# Patient Record
Sex: Female | Born: 1955 | Race: Black or African American | Hispanic: No | Marital: Single | Smoking: Never smoker
Health system: Southern US, Community
[De-identification: ages and names within clinical notes are randomized; demographics above are authoritative.]

## PROBLEM LIST (undated history)

## (undated) DIAGNOSIS — F419 Anxiety disorder, unspecified: Secondary | ICD-10-CM

## (undated) DIAGNOSIS — I219 Acute myocardial infarction, unspecified: Secondary | ICD-10-CM

## (undated) DIAGNOSIS — I10 Essential (primary) hypertension: Secondary | ICD-10-CM

## (undated) DIAGNOSIS — E119 Type 2 diabetes mellitus without complications: Secondary | ICD-10-CM

## (undated) DIAGNOSIS — I259 Chronic ischemic heart disease, unspecified: Secondary | ICD-10-CM

## (undated) DIAGNOSIS — I519 Heart disease, unspecified: Secondary | ICD-10-CM

## (undated) DIAGNOSIS — G4733 Obstructive sleep apnea (adult) (pediatric): Secondary | ICD-10-CM

## (undated) DIAGNOSIS — F329 Major depressive disorder, single episode, unspecified: Secondary | ICD-10-CM

## (undated) DIAGNOSIS — E039 Hypothyroidism, unspecified: Secondary | ICD-10-CM

## (undated) HISTORY — PX: ABDOMINAL HYSTERECTOMY: SHX81

## (undated) HISTORY — PX: CORONARY ANGIOPLASTY WITH STENT PLACEMENT: SHX49

## (undated) HISTORY — PX: HERNIA REPAIR: SHX51

## (undated) HISTORY — DX: Chronic ischemic heart disease, unspecified: I25.9

## (undated) HISTORY — DX: Major depressive disorder, single episode, unspecified: F32.9

## (undated) HISTORY — DX: Obstructive sleep apnea (adult) (pediatric): G47.33

## (undated) HISTORY — DX: Essential (primary) hypertension: I10

## (undated) HISTORY — DX: Type 2 diabetes mellitus without complications (CMS-HCC): E11.9

## (undated) HISTORY — DX: Heart disease, unspecified: I51.9

## (undated) HISTORY — DX: Hypothyroidism, unspecified: E03.9

---

## 2017-02-20 ENCOUNTER — Ambulatory Visit (INDEPENDENT_AMBULATORY_CARE_PROVIDER_SITE_OTHER): Payer: Medicare (Managed Care) | Admitting: Surgery

## 2017-02-20 ENCOUNTER — Encounter (INDEPENDENT_AMBULATORY_CARE_PROVIDER_SITE_OTHER): Payer: Self-pay | Admitting: Surgery

## 2017-02-20 VITALS — BP 90/62 | HR 65 | Temp 98.1°F | Resp 16 | Ht 66.54 in | Wt 171.0 lb

## 2017-02-20 DIAGNOSIS — K439 Ventral hernia without obstruction or gangrene: Secondary | ICD-10-CM

## 2017-02-20 DIAGNOSIS — K429 Umbilical hernia without obstruction or gangrene: Secondary | ICD-10-CM

## 2017-02-20 NOTE — Patient Instructions (Signed)
Please call your Cardiologist later this week to confirm that they've sent me the stress test results.    Contact my office after the stress test results have been sent to schedule your procedure.    You will need to stop you Plavix for 7-10 days prior to the surgery date.        Please call my office at [phone] to speak with my staff and to arrange a date for surgery.    You will need to meet with the anesthesia doctors at a separate appointment, usually within a week of the surgery date.  This appointment will be arranged by my office staff.    Nothing to eat or drink, by mouth, after midnight the night prior to surgery.    Please stop all herbal supplements, including fish oil, for 2 weeks prior to your operation.    Hold all NSAIDs (aspirin, ibuprofen, naprosyn, etc) and Clopidogrel (Plavix) 10-14 days prior to surgery.    Take all regular medications with a sip of water on morning of surgery except NSAIDs.

## 2017-02-20 NOTE — Progress Notes (Signed)
History and Physical     Referring MD:  Mar[facility]adip  [address]  [address]  [place], [place] [address]    Attending MD:   Dr. Lynne Logan    Chief Complaint: ventral hernia     History of Present Illness:     Catherine Harmon is a 61 year old female who is here for evaluation of the above chief complaint. Says has had a "lump" on stomach, unchanged in size, a/w some pain to palpation, present approx one year; reports never had surgery on abdomen before. Notes hernia not entirely reducible, as it "pops back out" when pressed. Denies overlying redness, drainage or other hernias in other locations. Denies F/C; had a cold last week. Denies N/V, CP or SOB. Takes daily plavix and ASA 325, last dose this morning.    Pain Assessment: Intensity: 8 Location: ventral hernia Quality: cramping    PMHx:  - HTN  - pre-diabetes  - CAD s/p MI (approx 2006) s/p PCI    PSx:  - hysterectomy  - R elbow fracture repair    Medications:   - patient will call back with list, states she does not remember names but says ~15    Allergies:  - sudafed (hyperventilation)  No current outpatient prescriptions on file.     No current facility-administered medications for this visit.      Social History     Social History    Marital status: Single     Spouse name: N/A    Number of children: N/A    Years of education: N/A     Social History Main Topics    Smoking status: Not on file    Smokeless tobacco: Not on file    Alcohol use Not on file    Drug use: Not on file    Sexual activity: Not on file     Social Activities of Daily Living Present    Not on file     Social History Narrative   SHx:  - former smoker, quit 1 year ago  - denies EtOH  - denies recreational drugs    FHx:  - MS (brother)  - "back cancer" (brother)  - DM (mother)    Review of Systems: Negative except per HPI    Physical Exam:  BP 90/62 (BP Location: Left arm, BP Patient Position: Sitting)   Pulse 65   Temp 98.1 F (36.7 C)   Resp 16   Ht 5' 6.53" (1.69 m)    Wt 77.6 kg (171 lb)   SpO2 93%   BMI 27.16 kg/m2     Gen - NAD, AA&O x3, slow but appropriate responses  Abd - one small umbilical hernia appreciated; epigastric hernia appreciated 2-3 fingerbreadths above umbilicus, mild pain to palpation, reducible, no overlying skin changes.    Assessment and Care Plan:  46 F w/PMHx significant for CAD c/b MI s/p PCI on ASA and Plavix, presents for evaluation of epigastric hernia; also found to have umbilical hernia. Plan for open hernia repair for both.    1. Discussed possibility of incarceration, strangulation, enlargement in size over time, and the risk of emergency surgery in the face of strangulation.  Also discussed the risk of surgery including recurrence which can be up to 50% in the case of incisional or complex hernias, use of prosthetic materials (mesh) and the increased risk of infxn, post-op infxn and the possible need for re-operation and removal of mesh if used, possibility  of post-op SBO or ileus, and the risks of general anesthetic including MI, CVA, sudden death or even reaction to anesthetic medications. The patient understands the risks, any and all questions were answered to the patient's satisfaction.    2. Patient has elected to proceed with surgical treatment. Procedure will be scheduled.  Written consent was obtained. The risks, benefits and alternatives of the planned procedure have been discussed with the patient and/or her legal representative, all questions have been answered.    3. Need patient's recent cardiac testing results from cardiologist prior to surgical procedure and anesthesia.    4. Will have to hold Plavix 7-10 days prior to procedure.    Does the patient have decision making capacity at this time? Yes    Patient and plan seen and discussed with Dr. Cheron SchaumannSandler.    Note Author: Cyndia DiverEmily Marylouise Mallet, MD

## 2017-02-20 NOTE — Progress Notes (Signed)
Attending Addendum:    Patient seen and examined - agree with resident/student's note.    She is a 61yo woman with a small, reducible epigastric hernia as well as a tiny umbilical hernia that are symptomatic and she would like repaired.  She has a history of MI in 2005, requiring PCI.  She had a stress test within the past year.    We had a lengthy discussion regarding the options for surgery.  Both open and laparoscopic repair options were discussed.   The risks of bleeding, infection, recurrence, possible internal organ, bowel or bladder injury, possible need for additional operation, including possible mesh removal, and the possibility of post-operative pain, both acutely and chronically, were all discussed.  She understands these risks and wishes to proceed with surgery.  Consent was obtained today for open epigastric and umbilical hernia repair with possible mesh.      I will ask her Cardiologist for a copy of her recent stress test.  Will schedule for open epigastric and umbilical hernia repair with possible mesh once we get a copy of her stress test.  I told her that I recommend holding her Plavix, if possible, for 7-10 days prior to her surgical procedure.        Mickle AsperBryan J. Cheron SchaumannSandler, MD FACS    H.S. Associate Professor of Surgery  Division of Minimally Invasive Surgery, Department of Surgery    Email: [email or web address]  Office: [phone]

## 2017-03-15 ENCOUNTER — Telehealth (INDEPENDENT_AMBULATORY_CARE_PROVIDER_SITE_OTHER): Payer: Self-pay | Admitting: Surgery

## 2017-03-15 NOTE — Telephone Encounter (Signed)
Pt calling to schedule surgery w/Dr Cheron Schaumann.    Requests call back    [phone]

## 2017-03-15 NOTE — Telephone Encounter (Signed)
Spoke to pt regarding the next available OR date.  I had left a message last Friday offering this Friday but pt had to be off her Plavix for 7 days and due to phone issues, she did not get my message until too late.    Pt is currently scheduled for 05/12/17 and I will call her if anyone cancels before then.

## 2017-04-19 ENCOUNTER — Telehealth (INDEPENDENT_AMBULATORY_CARE_PROVIDER_SITE_OTHER): Payer: Self-pay | Admitting: Surgery

## 2017-04-19 NOTE — Telephone Encounter (Signed)
Catherine Harmon      MR#:  [id number]      Thank you for choosing [facility] for your healthcare. The following arrangements have been made for your upcoming surgery:    Please remember not to eat or drink anything after midnight the night before surgery and to have a driver with you who can drive you home after your procedure.    Post-op instructions are attached.      Friday, May 12, 2017      TBD  Surgery Check-In    Front Information Desk, 1st Floor Lobby    [facility]  [address]  [place], [place]  [phone]      Open Umbilical Hernia Repair with Possible Mesh & Epigastric Hernia Repair by Dr Cheron SchaumannSandler      **OR time subject to change, I will call you in the afternoon the day before surgery with check in time**            Monday, May 29, 2017    11:20 a.m. Post-Operative Appointment     [facility]    [address]    [place], [place]  [id number] [facility] [place] [facility] on right)

## 2017-05-02 ENCOUNTER — Ambulatory Visit (HOSPITAL_BASED_OUTPATIENT_CLINIC_OR_DEPARTMENT_OTHER): Payer: Medicare (Managed Care) | Admitting: Anesthesiology

## 2017-05-11 ENCOUNTER — Telehealth (INDEPENDENT_AMBULATORY_CARE_PROVIDER_SITE_OTHER): Payer: Self-pay | Admitting: Surgery

## 2017-05-11 NOTE — Telephone Encounter (Signed)
Spoke to pt regarding check in time of 5:30 am for surgery on 05/12/17 with Dr Cheron SchaumannSandler.  Reminded pt to remain NPO after midnight and to have a driver.

## 2017-05-12 ENCOUNTER — Encounter (HOSPITAL_BASED_OUTPATIENT_CLINIC_OR_DEPARTMENT_OTHER): Admission: RE | Disposition: A | Discharge status: Home Health | Payer: Self-pay | Attending: Surgery

## 2017-05-12 ENCOUNTER — Ambulatory Visit
Admission: RE | Admit: 2017-05-12 | Discharge: 2017-05-12 | Disposition: A | Discharge status: Home / Self Care | Payer: Medicare (Managed Care) | Attending: Surgery | Admitting: Surgery

## 2017-05-12 DIAGNOSIS — Z79899 Other long term (current) drug therapy: Secondary | ICD-10-CM | POA: Insufficient documentation

## 2017-05-12 DIAGNOSIS — E039 Hypothyroidism, unspecified: Secondary | ICD-10-CM | POA: Insufficient documentation

## 2017-05-12 DIAGNOSIS — R0989 Other specified symptoms and signs involving the circulatory and respiratory systems: Secondary | ICD-10-CM | POA: Insufficient documentation

## 2017-05-12 DIAGNOSIS — R0789 Other chest pain: Secondary | ICD-10-CM | POA: Insufficient documentation

## 2017-05-12 DIAGNOSIS — R011 Cardiac murmur, unspecified: Secondary | ICD-10-CM | POA: Insufficient documentation

## 2017-05-12 DIAGNOSIS — Z7982 Long term (current) use of aspirin: Secondary | ICD-10-CM | POA: Insufficient documentation

## 2017-05-12 DIAGNOSIS — I10 Essential (primary) hypertension: Secondary | ICD-10-CM | POA: Insufficient documentation

## 2017-05-12 DIAGNOSIS — R29810 Facial weakness: Secondary | ICD-10-CM | POA: Insufficient documentation

## 2017-05-12 DIAGNOSIS — K429 Umbilical hernia without obstruction or gangrene: Secondary | ICD-10-CM | POA: Insufficient documentation

## 2017-05-12 DIAGNOSIS — G4733 Obstructive sleep apnea (adult) (pediatric): Secondary | ICD-10-CM | POA: Insufficient documentation

## 2017-05-12 DIAGNOSIS — Z955 Presence of coronary angioplasty implant and graft: Secondary | ICD-10-CM | POA: Insufficient documentation

## 2017-05-12 DIAGNOSIS — E7800 Pure hypercholesterolemia, unspecified: Secondary | ICD-10-CM | POA: Insufficient documentation

## 2017-05-12 DIAGNOSIS — K439 Ventral hernia without obstruction or gangrene: Secondary | ICD-10-CM

## 2017-05-12 DIAGNOSIS — K219 Gastro-esophageal reflux disease without esophagitis: Secondary | ICD-10-CM | POA: Insufficient documentation

## 2017-05-12 DIAGNOSIS — F419 Anxiety disorder, unspecified: Secondary | ICD-10-CM | POA: Insufficient documentation

## 2017-05-12 DIAGNOSIS — I739 Peripheral vascular disease, unspecified: Secondary | ICD-10-CM | POA: Insufficient documentation

## 2017-05-12 DIAGNOSIS — K458 Other specified abdominal hernia without obstruction or gangrene: Secondary | ICD-10-CM | POA: Insufficient documentation

## 2017-05-12 DIAGNOSIS — I259 Chronic ischemic heart disease, unspecified: Secondary | ICD-10-CM | POA: Insufficient documentation

## 2017-05-12 DIAGNOSIS — I2510 Atherosclerotic heart disease of native coronary artery without angina pectoris: Secondary | ICD-10-CM | POA: Insufficient documentation

## 2017-05-12 DIAGNOSIS — Z7902 Long term (current) use of antithrombotics/antiplatelets: Secondary | ICD-10-CM | POA: Insufficient documentation

## 2017-05-12 DIAGNOSIS — Z538 Procedure and treatment not carried out for other reasons: Principal | ICD-10-CM | POA: Insufficient documentation

## 2017-05-12 DIAGNOSIS — I341 Nonrheumatic mitral (valve) prolapse: Secondary | ICD-10-CM | POA: Insufficient documentation

## 2017-05-12 DIAGNOSIS — F329 Major depressive disorder, single episode, unspecified: Secondary | ICD-10-CM | POA: Insufficient documentation

## 2017-05-12 DIAGNOSIS — E119 Type 2 diabetes mellitus without complications: Secondary | ICD-10-CM | POA: Insufficient documentation

## 2017-05-12 LAB — GLUCOSE (POCT): Glucose (POCT): 118 mg/dL — ABNORMAL HIGH (ref 70–99)

## 2017-05-12 SURGERY — REPAIR, HERNIA, UMBILICAL
Site: Abdomen

## 2017-05-12 MED ORDER — BUPIVACAINE HCL (PF) 0.25 % IJ SOLN
INTRAMUSCULAR | Status: DC
Start: 2017-05-12 — End: 2017-05-12
  Filled 2017-05-12: qty 90

## 2017-05-12 MED ORDER — LACTATED RINGERS IV SOLN
INTRAVENOUS | Status: DC
Start: 2017-05-12 — End: 2017-05-12
  Administered 2017-05-12: 06:00:00 via INTRAVENOUS

## 2017-05-12 MED ORDER — LIDOCAINE HCL (PF) 1 % IJ SOLN
0.1000 mL | Freq: Once | INTRAMUSCULAR | Status: AC | PRN
Start: 2017-05-12 — End: 2017-05-12
  Administered 2017-05-12: 0.1 mL via INTRADERMAL
  Filled 2017-05-12: qty 2

## 2017-05-12 NOTE — Anesthesia Preprocedure Evaluation (Addendum)
ANESTHESIA PRE-OPERATIVE EVALUATION    Patient Information    Name: Catherine Harmon    MRN: [id number]    DOB: July 05, 1956    Age: 61 year old    Sex: female  Procedure(s):  REPAIR, HERNIA, UMBILICAL & Epigastric Hernia Repair      Pre-op Vitals:   BP 119/80 (BP Location: Left arm, BP Patient Position: Semi-Fowlers)   Pulse 70   Temp 36.1 C   Resp 15   Ht 5' 6.69" (1.694 m)   Wt 76 kg (167 lb 8 oz)   SpO2 99%   BMI 26.48 kg/m2   BMI kg/m2: 26.48 kg/m2    Primary language spoken:  English    ROS/Medical History:      General Review & History of Anesthetic Complications:        Outpatient meds:  abilify  Acyclovir  amlodipihne  ASA 81  atorvasstatin  Benztropine  Carvedilol  effexor  Hydroxyzine  Lisinopril  plavix  Venlafaxine  Zolpidem   Cardiovascular:    Exercise tolerance: >4 METS  (+)  hypercholesterolemia  hypertension  CADcardiac stents (2006)       (-) DOE    ROS comment: 04/28/15 - negative AMIBI, EF 42  07/08/16 - lexiscan possible septal/inferior reversible ischemia, EF 69  09/16/16 - TTE normal LV size and contractility    Peripheral vascular disease  Atypical chest pain relieved with tums    Mild mitral prolapse without significant regurg           Pulmonary:  - negative ROS     (-) asthma Hematology/Oncology:   - negative ROS   Neuro/Psych:            ROS Comments: Anxiety depression Infectious Disease:   Negative ROS         Endo/Other:      (+) diabetes mellitus (prediabetes, no meds),  GI/Hepatic:     (+) GERD well controlled,    Renal:    - Negative ROS        Pregnancy History:             Pre Anesthesia Testing (PCC/CPC) notes/comments:                   Physical Exam    Airway:  Inter-inciser distance 3-4 cm  Prognanth Able    Mallampati: II  Neck ROM: full  TM distance: 5-6 cm          Cardiovascular:    Rhythm: regular   Rate: normal   Positive for murmur and carotid bruit (bilateral)  Negative for peripheral edema     Pulmonary:       breath sounds clear to  auscultation        Neuro/Neck/Skeletal/Skin:      Dental:    (+) lower dentures and upper dentures    Abdominal:              Last  OSA (STOP BANG) Score:  No Data Recorded    Last OSA  (STOP) Score for   Has a physician diagnosed you with sleep apnea?: Yes  Do you use a CPAP at home?: Yes  Do you snore loudly (loud enough to be heard through a closed door)?: 1  Do you often feel tired, fatigued or sleepy during the day?: 1  Has anyone observed that you stop breathing while you are sleeping?: 1  Have you ever been treated for high blood pressure?: 1  OSA total score (A  score of 2 or more is high risk. Offer patient sleep study.): 4      Has a physician diagnosed you with sleep apnea?: Yes  Do you use a CPAP at home?: Yes  OSA total score (A score of 2 or more is high risk. Offer patient sleep study.): 4    Past Medical History:   Diagnosis Date    Diabetes mellitus (CMS-HCC)     Heart disease     Hypertension     Hypothyroidism     Ischemic heart disease     Major depressive disorder, single episode     OSA (obstructive sleep apnea)      No past surgical history on file.  Social History   Substance Use Topics    Smoking status: Never Smoker    Smokeless tobacco: Never Used    Alcohol use No       Current Facility-Administered Medications   Medication Dose Route Frequency Provider Last Rate Last Dose    lactated ringers infusion   IntraVENOUS Continuous Donnie Mesa, MD 30 mL/hr at 05/12/17 0615       No Known Allergies    Labs and Other Data  No results found for: NA, SODIUM, K, CL, BICARB, BUN, CREAT, GLU, [place]  No results found for: AST, ALT, GGT, LDH, ALK, TP, ALB, TBILI, DBILI  No results found for: WBC, RBC, HGB, HCT, MCV, MCHC, RDW, PLT, PLCTEL, MPV, MPVH, SEG, LYMPHS, MONOS, EOS, BASOS  No results found for: INR, PTT  No results found for: ARTPH, ARTPO2, ARTPCO2    Anesthesia Plan:  Risks and Benefits of Anesthesia  I personally examined the patient immediately prior to the anesthetic and  reviewed the pertinent medical history, drug and allergy history, laboratory and imaging studies and consultations. I have determined that the patient has had adequate assessment and testing.    Anesthetic techniques, invasive monitors, anesthetic drugs for induction, maintenance and post-operative analgesia, risks and alternatives have been explained to the patient and/or patient's representatives.    I have prescribed the anesthetic plan:         Planned anesthesia method: General         ASA 3 (Severe systemic disease)     Potential anesthesia problems identified and risks including but not limited to the following were discussed with patient and/or patient's representative: Dental injury or sore throat and Patient declined further  discussion of risks of anesthesia    Planned monitoring method: Routine monitoring  Comments: (Case cancelled for findings of new onset facial droop and bilateral carotid bruits. Patient to see PMD for workup. )    Informed Consent:  Anesthetic plan and risks discussed with Patient.

## 2017-05-12 NOTE — H&P (Deleted)
HISTORY & PHYSICAL - INTERVAL ASSESSMENT   **ONLY TO BE USED IN ADDITION TO A HISTORY & PHYSICAL**    Leonette MostLawanda J Ashworth  [id number]     This interval assessment is required for History & Physical completed less than 30 days but more than 24 hours prior to the admission or surgery. A History & Physical completed more than 30 days prior to the admission or surgery must be repeated.     Current Medical Status:   Unchanged     Medications / Allergies:   Unchanged     Review of Systems:   Unchanged     Physical Examination:   Unchanged     Laboratory or Clinical Data:   Unchanged     Modifications of Initial Care Plan:   Unchanged - Plan to proceed with open epigastric and umbilical hernia repair.      Mickle AsperBryan J. Cheron SchaumannSandler, MD    Associate Professor of Surgery  Division of Minimally Invasive Surgery, Department of Surgery    Office: [phone]

## 2017-05-12 NOTE — H&P (Addendum)
History and Physical     Demographics:  Date: 02/20/2017   Patient Name: Catherine Harmon   Medical Record #: [id number]   DOB: 07/13/1956  Age: 61 year old  Sex: female      Referring MD:  Catherine Harmon, Catherine Fix Jamey, MD  [address]  [place], [place] [phone]      Chief Complaint   Patient presents with    No chief complaint on file.          History of Present Illness:     Subjective:     Catherine Harmon is an 61 year old female who has an epigastric and umbilical hernia that she would like repaired.       Past Medical History:   Diagnosis Date    Diabetes mellitus (CMS-HCC)     Heart disease     Hypertension     Hypothyroidism     Ischemic heart disease     Major depressive disorder, single episode     OSA (obstructive sleep apnea)        No past surgical history on file.    No Known Allergies    No current facility-administered medications on file prior to encounter.      No current outpatient prescriptions on file prior to encounter.       Social History     Social History    Marital status: Single     Spouse name: N/A    Number of children: N/A    Years of education: N/A     Social History Main Topics    Smoking status: Never Smoker    Smokeless tobacco: Never Used    Alcohol use No    Drug use: Not on file    Sexual activity: Not on file     Social Activities of Daily Living Present    Not on file     Social History Narrative    No narrative on file       No family history on file.      REVIEW OF SYSTEMS   GEN: The patient has no weight loss, fevers, or chills  EYES:No change in vision.  ENT: No change in hearing. No epistaxis.   PULM: No dyspnea, productive cough, or wheezing  CARDIO:  No chest pain, tachycardias, dyspnea on exertion.  GI: No jaundice, hematesesis, abdominal pain, diarrhea or constipation.  GU: No dysuria or hematuria  ENDO: No diabetes.  JOINT:  No new joint pains.  HEME/LYMPHATIC: No bleeding, anemias, or lymphadenopathy.  NEURO: No loss of consciousness,  headaches.    Physical Exam:  Vitals:    05/12/17 0550   BP: 119/80   BP Location: Left arm   BP Patient Position: Semi-Fowlers   Pulse: 70   Resp: 15   Temp: 97 F (36.1 C)   SpO2: 99%   Weight: 76 kg (167 lb 8 oz)   Height: 5' 6.69" (1.694 m)     Body mass index is 26.48 kg/(m^2).        GENERAL APPEARANCE:  Healthy, alert, no distress, pleasant affect, cooperative.  HEART: Normal rate and regular rhythm, no murmurs, clicks, or gallops.  LUNGS: Clear to auscultation and percussion, no chest deformities noted.  ABDOMEN:  BS normal. Abdomen soft, non-tender. Small, reducible epigastric hernia present with umbilical hernia as well.  EXTREMITIES:  No cyanosis, clubbing, or edema   SKIN:  Negative  NEURO: Non-focal.  Assessment:     Catherine Harmon is a 61 year old female patient with an epigastric and umbilical hernia that she would like repaired.      In discussing her with both the Anesthesiologist as well as her boyfriend, her boyfriend states that she has had some left-sided facial drooping that he has noticed since February.  I also appreciate this today on examination.      She had seen her Cardiologist, who had evaluated her cardiac condition.  She has not had any imaging of her carotids though, and given the concerning exam findings, along with her other risk factors (high cholesterol, known PVD and CAD, etc), I discussed this with our Anesthesia staff today and we will have her further evaluated by her PCP prior to proceeding with this entirely elective epigastric and umbilical hernia repair.

## 2017-05-12 NOTE — Progress Notes (Signed)
Anesthesiology Preoperative Assessment  [facility]  05/12/2017    Ms. Ashworth presented today for surgical repair of epigastric and umbilical hernia. Per patient history and provided letter from her cardiologist, she has a medical history of coronary artery disease status post coronary stents, inducible ischemia on Lexiscan stress test, peripheral vascular disease, hypertension, hyperlipidemia, and pre-diabetes. On interview patient was noted to have a mild left sided facial droop and slow verbal responses to questions. Her fiance noted that these changes were new since February and provided before and after photos for confirmation. On physical exam I found mild bilateral carotid bruits, clear lung sounds, and mild systolic murmur. Of note, Ms. Ashworth has held her aspirin and plavix for one week prior to surgery.    Given these findings and the increased perioperative risk for cerebrovascular accidents we have elected to delay surgery for further workup. I recommend that the patient receives a carotid artery ultrasound and any further testing as indicated. She is scheduled to see her primary care physician on Monday 05/15/17. I have advised her to resume aspirin and plavix today. This plan has been discussed with Ms. Ashworth and her partner.     Al Pimpleennis Erma Joubert, MD

## 2017-05-29 ENCOUNTER — Encounter (INDEPENDENT_AMBULATORY_CARE_PROVIDER_SITE_OTHER): Payer: Medicare (Managed Care) | Admitting: Surgery

## 2018-01-08 ENCOUNTER — Institutional Professional Consult (permissible substitution) (INDEPENDENT_AMBULATORY_CARE_PROVIDER_SITE_OTHER): Admitting: Neurology

## 2018-01-08 ENCOUNTER — Encounter (INDEPENDENT_AMBULATORY_CARE_PROVIDER_SITE_OTHER): Payer: Self-pay

## 2020-03-12 ENCOUNTER — Encounter (INDEPENDENT_AMBULATORY_CARE_PROVIDER_SITE_OTHER): Payer: Self-pay

## 2020-12-08 ENCOUNTER — Encounter (HOSPITAL_BASED_OUTPATIENT_CLINIC_OR_DEPARTMENT_OTHER): Payer: Self-pay | Admitting: *Deleted

## 2020-12-08 ENCOUNTER — Other Ambulatory Visit: Payer: Self-pay

## 2020-12-08 ENCOUNTER — Emergency Department (HOSPITAL_BASED_OUTPATIENT_CLINIC_OR_DEPARTMENT_OTHER): Payer: Medicare HMO

## 2020-12-08 ENCOUNTER — Emergency Department (HOSPITAL_BASED_OUTPATIENT_CLINIC_OR_DEPARTMENT_OTHER)
Admission: EM | Admit: 2020-12-08 | Discharge: 2020-12-08 | Disposition: A | Discharge status: Home / Self Care | Payer: Medicare HMO | Attending: Emergency Medicine | Admitting: Emergency Medicine

## 2020-12-08 DIAGNOSIS — R059 Cough, unspecified: Secondary | ICD-10-CM

## 2020-12-08 DIAGNOSIS — Z20822 Contact with and (suspected) exposure to covid-19: Secondary | ICD-10-CM | POA: Insufficient documentation

## 2020-12-08 DIAGNOSIS — R0981 Nasal congestion: Secondary | ICD-10-CM | POA: Diagnosis not present

## 2020-12-08 DIAGNOSIS — I2510 Atherosclerotic heart disease of native coronary artery without angina pectoris: Secondary | ICD-10-CM | POA: Diagnosis not present

## 2020-12-08 DIAGNOSIS — R067 Sneezing: Secondary | ICD-10-CM | POA: Insufficient documentation

## 2020-12-08 MED ORDER — FLUTICASONE PROPIONATE 50 MCG/ACT NA SUSP: 1.0000 | Freq: Every day | NASAL | 0 refills | Status: AC

## 2020-12-08 MED ORDER — BENZONATATE 100 MG PO CAPS: 100.0000 mg | ORAL_CAPSULE | Freq: Three times a day (TID) | ORAL | 0 refills | Status: AC

## 2020-12-08 NOTE — ED Triage Notes (Signed)
C/o cough congestion x 1 day

## 2020-12-08 NOTE — ED Provider Notes (Signed)
[facility] EMERGENCY DEPARTMENT Provider Note   CSN: [id number] Arrival date & time: 12/08/20  1426     History Chief Complaint  Patient presents with  . URI    Daurice Ashworth Waynetta Sandy is a 64 y.o. female with a hx of CAD who presents to the ED with complaints of URI sxs x 2 days. Patient states she has had nasal congestion, sneezing, and cough productive of yellow phlegm sputum. No alleviating/aggravating factors. No intervention PTA. Denies fever, chills, dyspnea, chest pain, nausea, vomiting, or diarrhea. Vaccinated against covid.   HPI     History reviewed. No pertinent past medical history.  There are no problems to display for this patient.   History reviewed. No pertinent surgical history.   OB History   No obstetric history on file.     No family history on file.     Home Medications Prior to Admission medications   Not on File    Allergies    Patient has no allergy information on record.  Review of Systems   Review of Systems  Constitutional: Negative for chills and fever.  HENT: Positive for congestion. Negative for ear pain and sore throat.   Respiratory: Positive for cough. Negative for shortness of breath.   Cardiovascular: Negative for chest pain.  Gastrointestinal: Negative for abdominal pain, diarrhea and vomiting.  Neurological: Negative for syncope.  All other systems reviewed and are negative.   Physical Exam Updated Vital Signs BP 138/84 (BP Location: Left Arm)   Pulse 74   Temp 99.5 F (37.5 C) (Oral)   Resp 20   Ht 5\' 8"  (1.727 m)   Wt 56.2 kg   SpO2 96%   BMI 18.85 kg/m   Physical Exam Vitals and nursing note reviewed.  Constitutional:      General: She is not in acute distress.    Appearance: She is well-developed.  HENT:     Head: Normocephalic and atraumatic.     Right Ear: Ear canal normal. Tympanic membrane is not perforated, erythematous, retracted or bulging.     Left Ear: Ear canal normal. Tympanic  membrane is not perforated, erythematous, retracted or bulging.     Ears:     Comments: No mastoid erythema/swelling/tenderness.     Nose: Congestion present.     Right Sinus: No maxillary sinus tenderness or frontal sinus tenderness.     Left Sinus: No maxillary sinus tenderness or frontal sinus tenderness.     Mouth/Throat:     Pharynx: Uvula midline. No oropharyngeal exudate or posterior oropharyngeal erythema.     Comments: Posterior oropharynx is symmetric appearing. Patient tolerating own secretions without difficulty. No trismus. No drooling. No hot potato voice. No swelling beneath the tongue, submandibular compartment is soft.  Eyes:     General:        Right eye: No discharge.        Left eye: No discharge.     Conjunctiva/sclera: Conjunctivae normal.     Pupils: Pupils are equal, round, and reactive to light.  Cardiovascular:     Rate and Rhythm: Normal rate and regular rhythm.     Heart sounds: No murmur heard.   Pulmonary:     Effort: Pulmonary effort is normal. No respiratory distress.     Breath sounds: Normal breath sounds. No wheezing, rhonchi or rales.  Abdominal:     General: There is no distension.     Palpations: Abdomen is soft.     Tenderness: There is no abdominal  tenderness.  Musculoskeletal:     Cervical back: Normal range of motion and neck supple. No edema or rigidity.  Lymphadenopathy:     Cervical: No cervical adenopathy.  Skin:    General: Skin is warm and dry.     Findings: No rash.  Neurological:     Mental Status: She is alert.  Psychiatric:        Behavior: Behavior normal.     ED Results / Procedures / Treatments   Labs (all labs ordered are listed, but only abnormal results are displayed) Labs Reviewed - No data to display  EKG None  Radiology DG Chest [facility] 1 View  Result Date: 12/08/2020 CLINICAL DATA:  Cough, congestion for 1 day, upper respiratory infection EXAM: PORTABLE CHEST 1 VIEW COMPARISON:  None. FINDINGS: Single  frontal view of the chest demonstrates an unremarkable cardiac silhouette. No airspace disease, effusion, or pneumothorax. No acute bony abnormalities. IMPRESSION: 1. No acute intrathoracic process. Electronically Signed   By: Sharlet Salina M.D.   On: 12/08/2020 15:43    Procedures Procedures (including critical care time)  Medications Ordered in ED Medications - No data to display  ED Course  I have reviewed the triage vital signs and the nursing notes.  Pertinent labs & imaging results that were available during my care of the patient were reviewed by me and considered in my medical decision making (see chart for details).    Yessenia Ashworth Moon was evaluated in Emergency Department on 12/08/2020 for the symptoms described in the history of present illness. He/she was evaluated in the context of the global COVID-19 pandemic, which necessitated consideration that the patient might be at risk for infection with the SARS-CoV-2 virus that causes COVID-19. Institutional protocols and algorithms that pertain to the evaluation of patients at risk for COVID-19 are in a state of rapid change based on information released by regulatory bodies including the CDC and federal and state organizations. These policies and algorithms were followed during the patient's care in the ED.  MDM Rules/Calculators/A&P                         Patient presents to the ED with complaints of nasal congestion & cough.  Nontoxic, vitals WNL.   Additional history obtained:  Additional history obtained from chart review & nursing note review.   Imaging Studies ordered:  I ordered imaging studies which included CXR, I independently visualized and interpreted imaging which showed no acute process.   ED Course:  Exam is without signs of AOM, AOE, or mastoiditis. Oropharyngeal exam is benign. No sinus tenderness. No meningeal signs. Lungs are CTA without focal adventitious sounds, no signs of increased work of breathing,  CXR without infiltrate, doubt CAP. Abdomen nontender w/o peritoneal signs. Overall suspect viral vs allergic.  COVID testing obtained & pending. Recommended supportive care. I discussed results, treatment plan, need for follow-up, and return precautions with the patient.. Provided opportunity for questions, patient confirmed understanding and is in agreement with plan.   Portions of this note were generated with Scientist, clinical (histocompatibility and immunogenetics). Dictation errors may occur despite best attempts at proofreading.   Final Clinical Impression(s) / ED Diagnoses Final diagnoses:  Cough    Rx / DC Orders ED Discharge Orders         Ordered    fluticasone (FLONASE) 50 MCG/ACT nasal spray  Daily        12/08/20 1821    benzonatate (TESSALON) 100 MG capsule  Every 8 hours        12/08/20 1821           Dawnmarie Breon, Pleas Koch, PA-C 12/08/20 Lonna Duval, MD 12/08/20 [phone]

## 2020-12-08 NOTE — Discharge Instructions (Addendum)
You were seen in the ER today for cough and congestion.  Your chest x-ray was normal.   We have tested you for COVID 19, we will call you within the next 48 hours if results are positive, you may also view these results on MyChart.   We are instructing patient's with COVID 19 or symptoms of COVID 19 to isolate themselves for 14 days. You may be able to discontinue self isolation if the following conditions are met:   Persons with COVID-19 who have symptoms and were directed to care for themselves at home may discontinue home isolation under the  following conditions: - It has been at least 10 days have passed since symptoms first appeared. - AND at least 3 days (72 hours) have passed since recovery defined as resolution of fever without the use of fever-reducing medications and improvement in respiratory symptoms (e.g., cough, shortness of breath)  Please follow the below instructions.   We suspect your symptoms are related to a virus or allergies.  We are sending home with Flonase to use 1 spray per nostril daily as needed for nasal congestion as well as Tessalon to take every 8 hours as needed for coughing.  We have prescribed you new medication(s) today. Discuss the medications prescribed today with your pharmacist as they can have adverse effects and interactions with your other medicines including over the counter and prescribed medications. Seek medical evaluation if you start to experience new or abnormal symptoms after taking one of these medicines, seek care immediately if you start to experience difficulty breathing, feeling of your throat closing, facial swelling, or rash as these could be indications of a more serious allergic reaction   Please follow up with primary care within 3-5 days for re-evaluation- call prior to going to the office to make them aware of your symptoms as some offices are altering their method of seeing patients with COVID 19 symptoms, we have also provided our  [place] covid clinic for follow up as well.  Return to the ER for new or worsening symptoms including but not limited to increased work of breathing, fever, coughing up blood, chest pain, passing out, or any other concerns.       Person Under Monitoring Name: Sara Yoder  Location: [address] [place] [address]   Infection Prevention Recommendations for Individuals Confirmed to have, or Being Evaluated for, 2019 Novel Coronavirus (COVID-19) Infection Who Receive Care at Home  Individuals who are confirmed to have, or are being evaluated for, COVID-19 should follow the prevention steps below until a healthcare provider or local or state health department says they can return to normal activities.  Stay home except to get medical care You should restrict activities outside your home, except for getting medical care. Do not go to work, school, or public areas, and do not use public transportation or taxis.  Call ahead before visiting your doctor Before your medical appointment, call the healthcare provider and tell them that you have, or are being evaluated for, COVID-19 infection. This will help the healthcare provider's office take steps to keep other people from getting infected. Ask your healthcare provider to call the local or state health department.  Monitor your symptoms Seek prompt medical attention if your illness is worsening (e.g., difficulty breathing). Before going to your medical appointment, call the healthcare provider and tell them that you have, or are being evaluated for, COVID-19 infection. Ask your healthcare provider to call the local or state health department.  Wear a facemask You should wear a facemask that covers your nose and mouth when you are in the same room with other people and when you visit a healthcare provider. People who live with or visit you should also wear a facemask while they are in the same room with you.  Separate  yourself from other people in your home As much as possible, you should stay in a different room from other people in your home. Also, you should use a separate bathroom, if available.  Avoid sharing household items You should not share dishes, drinking glasses, cups, eating utensils, towels, bedding, or other items with other people in your home. After using these items, you should wash them thoroughly with soap and water.  Cover your coughs and sneezes Cover your mouth and nose with a tissue when you cough or sneeze, or you can cough or sneeze into your sleeve. Throw used tissues in a lined trash can, and immediately wash your hands with soap and water for at least 20 seconds or use an alcohol-based hand rub.  Wash your [facility] your hands often and thoroughly with soap and water for at least 20 seconds. You can use an alcohol-based hand sanitizer if soap and water are not available and if your hands are not visibly dirty. Avoid touching your eyes, nose, and mouth with unwashed hands.   Prevention Steps for Caregivers and Household Members of Individuals Confirmed to have, or Being Evaluated for, COVID-19 Infection Being Cared for in the Home  If you live with, or provide care at home for, a person confirmed to have, or being evaluated for, COVID-19 infection please follow these guidelines to prevent infection:  Follow healthcare provider's instructions Make sure that you understand and can help the patient follow any healthcare provider instructions for all care.  Provide for the patient's basic needs You should help the patient with basic needs in the home and provide support for getting groceries, prescriptions, and other personal needs.  Monitor the patient's symptoms If they are getting sicker, call his or her medical provider and tell them that the patient has, or is being evaluated for, COVID-19 infection. This will help the healthcare provider's office take steps to keep  other people from getting infected. Ask the healthcare provider to call the local or state health department.  Limit the number of people who have contact with the patient If possible, have only one caregiver for the patient. Other household members should stay in another home or place of residence. If this is not possible, they should stay in another room, or be separated from the patient as much as possible. Use a separate bathroom, if available. Restrict visitors who do not have an essential need to be in the home.  Keep older adults, very young children, and other sick people away from the patient Keep older adults, very young children, and those who have compromised immune systems or chronic health conditions away from the patient. This includes people with chronic heart, lung, or kidney conditions, diabetes, and cancer.  Ensure good ventilation Make sure that shared spaces in the home have good air flow, such as from an air conditioner or an opened window, weather permitting.  Wash your hands often Wash your hands often and thoroughly with soap and water for at least 20 seconds. You can use an alcohol based hand sanitizer if soap and water are not available and if your hands are not visibly dirty. Avoid touching your eyes, nose, and  mouth with unwashed hands. Use disposable paper towels to dry your hands. If not available, use dedicated cloth towels and replace them when they become wet.  Wear a facemask and gloves Wear a disposable facemask at all times in the room and gloves when you touch or have contact with the patient's blood, body fluids, and/or secretions or excretions, such as sweat, saliva, sputum, nasal mucus, vomit, urine, or feces.  Ensure the mask fits over your nose and mouth tightly, and do not touch it during use. Throw out disposable facemasks and gloves after using them. Do not reuse. Wash your hands immediately after removing your facemask and gloves. If your  personal clothing becomes contaminated, carefully remove clothing and launder. Wash your hands after handling contaminated clothing. Place all used disposable facemasks, gloves, and other waste in a lined container before disposing them with other household waste. Remove gloves and wash your hands immediately after handling these items.  Do not share dishes, glasses, or other household items with the patient Avoid sharing household items. You should not share dishes, drinking glasses, cups, eating utensils, towels, bedding, or other items with a patient who is confirmed to have, or being evaluated for, COVID-19 infection. After the person uses these items, you should wash them thoroughly with soap and water.  Wash laundry thoroughly Immediately remove and wash clothes or bedding that have blood, body fluids, and/or secretions or excretions, such as sweat, saliva, sputum, nasal mucus, vomit, urine, or feces, on them. Wear gloves when handling laundry from the patient. Read and follow directions on labels of laundry or clothing items and detergent. In general, wash and dry with the warmest temperatures recommended on the label.  Clean all areas the individual has used often Clean all touchable surfaces, such as counters, tabletops, doorknobs, bathroom fixtures, toilets, phones, keyboards, tablets, and bedside tables, every day. Also, clean any surfaces that may have blood, body fluids, and/or secretions or excretions on them. Wear gloves when cleaning surfaces the patient has come in contact with. Use a diluted bleach solution (e.g., dilute bleach with 1 part bleach and 10 parts water) or a household disinfectant with a label that says EPA-registered for coronaviruses. To make a bleach solution at home, add 1 tablespoon of bleach to 1 quart (4 cups) of water. For a larger supply, add  cup of bleach to 1 gallon (16 cups) of water. Read labels of cleaning products and follow recommendations provided on  product labels. Labels contain instructions for safe and effective use of the cleaning product including precautions you should take when applying the product, such as wearing gloves or eye protection and making sure you have good ventilation during use of the product. Remove gloves and wash hands immediately after cleaning.  Monitor yourself for signs and symptoms of illness Caregivers and household members are considered close contacts, should monitor their health, and will be asked to limit movement outside of the home to the extent possible. Follow the monitoring steps for close contacts listed on the symptom monitoring form.   ? If you have additional questions, contact your local health department or call the epidemiologist on call at [phone] (available 24/7). ? This guidance is subject to change. For the most up-to-date guidance from [facility], please refer to their website: [email or web address]

## 2020-12-08 NOTE — ED Notes (Signed)
ED Provider at bedside. °

## 2020-12-09 LAB — SARS CORONAVIRUS 2 (TAT 6-24 HRS): SARS Coronavirus 2: NEGATIVE

## 2021-03-19 ENCOUNTER — Encounter (HOSPITAL_BASED_OUTPATIENT_CLINIC_OR_DEPARTMENT_OTHER): Payer: Self-pay

## 2021-03-19 ENCOUNTER — Emergency Department (HOSPITAL_BASED_OUTPATIENT_CLINIC_OR_DEPARTMENT_OTHER)
Admission: EM | Admit: 2021-03-19 | Discharge: 2021-03-19 | Disposition: A | Discharge status: Home / Self Care | Payer: Medicare Other | Attending: Emergency Medicine | Admitting: Emergency Medicine

## 2021-03-19 ENCOUNTER — Emergency Department (HOSPITAL_BASED_OUTPATIENT_CLINIC_OR_DEPARTMENT_OTHER): Payer: Medicare Other

## 2021-03-19 ENCOUNTER — Other Ambulatory Visit: Payer: Self-pay

## 2021-03-19 DIAGNOSIS — Z955 Presence of coronary angioplasty implant and graft: Secondary | ICD-10-CM | POA: Diagnosis not present

## 2021-03-19 DIAGNOSIS — R519 Headache, unspecified: Secondary | ICD-10-CM | POA: Insufficient documentation

## 2021-03-19 DIAGNOSIS — I10 Essential (primary) hypertension: Secondary | ICD-10-CM | POA: Diagnosis not present

## 2021-03-19 DIAGNOSIS — M542 Cervicalgia: Secondary | ICD-10-CM | POA: Insufficient documentation

## 2021-03-19 DIAGNOSIS — M545 Low back pain, unspecified: Secondary | ICD-10-CM

## 2021-03-19 DIAGNOSIS — M25511 Pain in right shoulder: Secondary | ICD-10-CM

## 2021-03-19 DIAGNOSIS — Y92410 Unspecified street and highway as the place of occurrence of the external cause: Secondary | ICD-10-CM | POA: Insufficient documentation

## 2021-03-19 HISTORY — DX: Acute myocardial infarction, unspecified: I21.9

## 2021-03-19 HISTORY — DX: Anxiety disorder, unspecified: F41.9

## 2021-03-19 HISTORY — DX: Essential (primary) hypertension: I10

## 2021-03-19 MED ORDER — METHOCARBAMOL 500 MG PO TABS: 500.0000 mg | ORAL_TABLET | Freq: Two times a day (BID) | ORAL | 0 refills | Status: AC
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
